# Patient Record
Sex: Male | Born: 2000 | Race: White | Hispanic: No | Marital: Single | Smoking: Current every day smoker
Health system: Southern US, Community
[De-identification: ages and names within clinical notes are randomized; demographics above are authoritative.]

---
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
## 2023-05-02 ENCOUNTER — Ambulatory Visit (INDEPENDENT_AMBULATORY_CARE_PROVIDER_SITE_OTHER): Payer: 59 | Admitting: Family Medicine

## 2023-05-02 ENCOUNTER — Encounter: Payer: Self-pay | Admitting: Family Medicine

## 2023-05-02 VITALS — BP 130/68 | HR 94 | Temp 98.2°F | Ht 71.0 in | Wt 170.0 lb

## 2023-05-02 DIAGNOSIS — Z13220 Encounter for screening for lipoid disorders: Secondary | ICD-10-CM

## 2023-05-02 DIAGNOSIS — Z0000 Encounter for general adult medical examination without abnormal findings: Secondary | ICD-10-CM | POA: Insufficient documentation

## 2023-05-02 DIAGNOSIS — Z0001 Encounter for general adult medical examination with abnormal findings: Secondary | ICD-10-CM | POA: Diagnosis not present

## 2023-05-02 DIAGNOSIS — Z1159 Encounter for screening for other viral diseases: Secondary | ICD-10-CM

## 2023-05-02 DIAGNOSIS — Z114 Encounter for screening for human immunodeficiency virus [HIV]: Secondary | ICD-10-CM | POA: Diagnosis not present

## 2023-05-02 DIAGNOSIS — R41840 Attention and concentration deficit: Secondary | ICD-10-CM

## 2023-05-02 NOTE — Patient Instructions (Signed)
 It was great to meet you today and I'm excited to have you join the [facility] practice. I hope you had a positive experience today! If you feel so inclined, please feel free to recommend our practice to friends and family. Jeoffrey Barrio, FNP-C

## 2023-05-02 NOTE — Progress Notes (Addendum)
 New Patient Office Visit  Subjective    Patient ID: Luis Wang, male    DOB: [birth year]  Age: 23 y.o. MRN: [id number]  CC:  Chief Complaint  Patient presents with   Establish Care    HPI Luis Wang presents to establish care. Oriented to practice routines and expectations. No PCP in many years. PMH includes none. Current concerns include possible ADHD. He is studying physical therapy in college and works as a production assistant, radio at [employer]. He has difficulty getting distracted and remembering things.   Tobacco: smoker  (0.33 ppd x  yrs) STI: declines Vaccines:  utd  Work/school performance:  average Difficulty sustaining attention/completing tasks: yes Distracted by extraneous stimuli: yes Does not listen when spoken to: yes  Fidgets with hands or feet: yes Unable to stay in seat: yes Blurts out/interrupts others: yes     Outpatient Encounter Medications as of 05/02/2023  Medication Sig   acetaminophen (TYLENOL) 160 MG/5ML suspension Take 15 mg/kg by mouth every 6 (six) hours as needed.   ibuprofen (ADVIL,MOTRIN) 100 MG/5ML suspension Take 10 mg/kg by mouth every 6 (six) hours as needed.   No facility-administered encounter medications on file as of 05/02/2023.    History reviewed. No pertinent past medical history.  History reviewed. No pertinent surgical history.  History reviewed. No pertinent family history.  Social History   Socioeconomic History   Marital status: Single    Spouse name: Not on file   Number of children: Not on file   Years of education: Not on file   Highest education level: Not on file  Occupational History   Not on file  Tobacco Use   Smoking status: Every Day    Current packs/day: 0.25    Average packs/day: 0.3 packs/day for 5.0 years (1.3 ttl pk-yrs)    Types: Cigarettes    Start date: 2020   Smokeless tobacco: Not on file  Vaping Use   Vaping status: Some Days  Substance and Sexual Activity   Alcohol use: Yes    Alcohol/week: 14.0  standard drinks of alcohol    Types: 14 Cans of beer per week    Comment: at a bottle of liquor   Drug use: Yes    Types: Marijuana   Sexual activity: Never  Other Topics Concern   Not on file  Social History Narrative   Not on file   Social Drivers of Health   Financial Resource Strain: Not on file  Food Insecurity: Not on file  Transportation Needs: Not on file  Physical Activity: Not on file  Stress: Not on file  Social Connections: Not on file  Intimate Partner Violence: Not on file    Review of Systems  Constitutional: Negative.   HENT: Negative.    Eyes: Negative.   Respiratory: Negative.    Cardiovascular: Negative.   Gastrointestinal: Negative.   Genitourinary: Negative.   Musculoskeletal: Negative.   Skin: Negative.   Neurological: Negative.   Endo/Heme/Allergies: Negative.   Psychiatric/Behavioral: Negative.    All other systems reviewed and are negative.       Objective    BP 130/68   Pulse 94   Temp 98.2 F (36.8 C) (Oral)   Ht 5' 11 (1.803 m)   Wt 170 lb (77.1 kg)   SpO2 96%   BMI 23.71 kg/m   Physical Exam Vitals and nursing note reviewed.  Constitutional:      Appearance: Normal appearance. He is normal weight.  HENT:  Head: Normocephalic and atraumatic.     Right Ear: Tympanic membrane, ear canal and external ear normal.     Left Ear: Tympanic membrane, ear canal and external ear normal.     Nose: Nose normal.     Mouth/Throat:     Mouth: Mucous membranes are moist.     Pharynx: Oropharynx is clear.  Eyes:     Extraocular Movements: Extraocular movements intact.     Right eye: Normal extraocular motion and no nystagmus.     Left eye: Normal extraocular motion and no nystagmus.     Conjunctiva/sclera: Conjunctivae normal.     Pupils: Pupils are equal, round, and reactive to light.  Cardiovascular:     Rate and Rhythm: Normal rate and regular rhythm.     Pulses: Normal pulses.     Heart sounds: Normal heart sounds.   Pulmonary:     Effort: Pulmonary effort is normal.     Breath sounds: Normal breath sounds.  Abdominal:     General: Bowel sounds are normal.     Palpations: Abdomen is soft.  Genitourinary:    Comments: Deferred using shared decision making Musculoskeletal:        General: Normal range of motion.     Cervical back: Normal range of motion and neck supple.  Skin:    General: Skin is warm and dry.     Capillary Refill: Capillary refill takes less than 2 seconds.  Neurological:     General: No focal deficit present.     Mental Status: He is alert. Mental status is at baseline.  Psychiatric:        Mood and Affect: Mood normal.        Speech: Speech normal.        Behavior: Behavior normal.        Thought Content: Thought content normal.        Cognition and Memory: Cognition and memory normal.        Judgment: Judgment normal.         Assessment & Plan:   Problem List Items Addressed This Visit     Physical exam, annual - Primary   Today your medical history was reviewed and routine physical exam with labs was performed. Recommend 150 minutes of moderate intensity exercise weekly and consuming a well-balanced diet. Advised to stop smoking if a smoker, avoid smoking if a non-smoker, limit alcohol consumption to 1 drink per day for women and 2 drinks per day for men, and avoid illicit drug use. Counseled on safe sex practices and offered STI testing today. Counseled on the importance of sunscreen use. Counseled in mental health awareness and when to seek medical care. Vaccine maintenance discussed. Appropriate health maintenance items reviewed. Return to office in 1 year for annual physical exam.       Relevant Orders   CBC with Differential/Platelet (Completed)   COMPLETE METABOLIC PANEL WITH GFR (Completed)   Hepatitis C antibody   HIV Antibody (routine testing w rflx)   LDL Cholesterol, Direct (Completed)   Attention or concentration deficit   Pt reports difficutlly  focusing at work and school and would like eval for ADHD. Referral placed for psychiatry and provided handout for GPW.      Relevant Orders   Ambulatory referral to Psychiatry   Other Visit Diagnoses       Screening for HIV (human immunodeficiency virus)       Relevant Orders   HIV Antibody (routine testing w rflx)  Need for hepatitis C screening test       Relevant Orders   Hepatitis C antibody     Screening for lipoid disorders       Relevant Orders   LDL Cholesterol, Direct (Completed)       Return in about 1 year (around 05/01/2024) for annual physical with labs 1 week prior.   Jeoffrey GORMAN Barrio, FNP

## 2023-05-02 NOTE — Assessment & Plan Note (Signed)

## 2023-05-02 NOTE — Assessment & Plan Note (Signed)
 Pt reports difficutlly focusing at work and school and would like eval for ADHD. Referral placed for psychiatry and provided handout for GPW.

## 2023-05-03 ENCOUNTER — Telehealth: Payer: Self-pay | Admitting: Family Medicine

## 2023-05-03 LAB — COMPLETE METABOLIC PANEL WITH GFR
AG Ratio: 1.5 (calc) (ref 1.0–2.5)
ALT: 15 U/L (ref 9–46)
AST: 17 U/L (ref 10–40)
Albumin: 4.1 g/dL (ref 3.6–5.1)
Alkaline phosphatase (APISO): 61 U/L (ref 36–130)
BUN: 11 mg/dL (ref 7–25)
CO2: 26 mmol/L (ref 20–32)
Calcium: 9.4 mg/dL (ref 8.6–10.3)
Chloride: 105 mmol/L (ref 98–110)
Creat: 0.8 mg/dL (ref 0.60–1.24)
Globulin: 2.8 g/dL (ref 1.9–3.7)
Glucose, Bld: 97 mg/dL (ref 65–99)
Potassium: 4.3 mmol/L (ref 3.5–5.3)
Sodium: 140 mmol/L (ref 135–146)
Total Bilirubin: 0.4 mg/dL (ref 0.2–1.2)
Total Protein: 6.9 g/dL (ref 6.1–8.1)
eGFR: 130 mL/min/{1.73_m2} (ref >=60)

## 2023-05-03 LAB — CBC WITH DIFFERENTIAL/PLATELET
Absolute Lymphocytes: 1773 {cells}/uL (ref 850–3900)
Absolute Monocytes: 511 {cells}/uL (ref 200–950)
Basophils Absolute: 41 {cells}/uL (ref 0–200)
Basophils Relative: 0.6 %
Eosinophils Absolute: 193 {cells}/uL (ref 15–500)
Eosinophils Relative: 2.8 %
HCT: 43 % (ref 38.5–50.0)
Hemoglobin: 15 g/dL (ref 13.2–17.1)
MCH: 32.3 pg (ref 27.0–33.0)
MCHC: 34.9 g/dL (ref 32.0–36.0)
MCV: 92.5 fL (ref 80.0–100.0)
MPV: 11.4 fL (ref 7.5–12.5)
Monocytes Relative: 7.4 %
Neutro Abs: 4382 {cells}/uL (ref 1500–7800)
Neutrophils Relative %: 63.5 %
Platelets: 256 10*3/uL (ref 140–400)
RBC: 4.65 10*6/uL (ref 4.20–5.80)
RDW: 12 % (ref 11.0–15.0)
Total Lymphocyte: 25.7 %
WBC: 6.9 10*3/uL (ref 3.8–10.8)

## 2023-05-03 LAB — HEPATITIS C ANTIBODY: Hepatitis C Ab: NONREACTIVE

## 2023-05-03 LAB — HIV ANTIBODY (ROUTINE TESTING W REFLEX): HIV 1&2 Ab, 4th Generation: NONREACTIVE

## 2023-05-03 LAB — LDL CHOLESTEROL, DIRECT: Direct LDL: 92 mg/dL (ref <100)

## 2023-05-03 NOTE — Telephone Encounter (Signed)
 Called patient to report normal lab results in addition to discussion regarding PHQ and GAD that were completed just prior to his departure after visit with myself. Spoke with patient and he reported he is not feeling overwhelmed with feelings of depression or anxiety and he denies SI/HI. I provided him with resources should he begin to enperience these including [facility]. Encouraged to seek medical care if he begins experiencing symptoms of anxiety or depression. Psych referral was placed at his OV.

## 2023-05-12 ENCOUNTER — Telehealth: Payer: Self-pay

## 2023-05-12 NOTE — Telephone Encounter (Signed)
Trula Slade sent to Park Meo, FNP Cc: Arta Silence, CMA Amber:  In this referral, the dept requested to send the patient is ([facility] which is located in [place], but in the note section of the referral [facility] is requested, and they are located in [place].  Please advise which facility to send the referral to.  Thanks [employer]

## 2024-03-15 ENCOUNTER — Encounter: Payer: Self-pay | Admitting: Family Medicine
